# Patient Record
Sex: Male | Born: 1965 | Race: White | Hispanic: No | Marital: Married | Smoking: Never smoker
Health system: Southern US, Community
[De-identification: ages and names within clinical notes are randomized; demographics above are authoritative.]

---

## 2019-11-10 ENCOUNTER — Emergency Department (INDEPENDENT_AMBULATORY_CARE_PROVIDER_SITE_OTHER): Payer: BC Managed Care – PPO

## 2019-11-10 ENCOUNTER — Emergency Department (INDEPENDENT_AMBULATORY_CARE_PROVIDER_SITE_OTHER)
Admission: RE | Admit: 2019-11-10 | Discharge: 2019-11-10 | Disposition: A | Discharge status: Home / Self Care | Payer: BC Managed Care – PPO | Source: Ambulatory Visit | Attending: Family Medicine | Admitting: Family Medicine

## 2019-11-10 ENCOUNTER — Other Ambulatory Visit: Payer: Self-pay

## 2019-11-10 VITALS — BP 137/92 | HR 72 | Temp 98.1°F | Resp 16 | Wt 155.8 lb

## 2019-11-10 DIAGNOSIS — R6881 Early satiety: Secondary | ICD-10-CM

## 2019-11-10 DIAGNOSIS — Z8639 Personal history of other endocrine, nutritional and metabolic disease: Secondary | ICD-10-CM

## 2019-11-10 DIAGNOSIS — R140 Abdominal distension (gaseous): Secondary | ICD-10-CM

## 2019-11-10 LAB — COMPLETE METABOLIC PANEL WITH GFR
AG Ratio: 1.5 (calc) (ref 1.0–2.5)
ALT: 32 U/L (ref 9–46)
AST: 38 U/L — ABNORMAL HIGH (ref 10–35)
Albumin: 4.5 g/dL (ref 3.6–5.1)
Alkaline phosphatase (APISO): 89 U/L (ref 35–144)
BUN: 18 mg/dL (ref 7–25)
CO2: 26 mmol/L (ref 20–32)
Calcium: 9.4 mg/dL (ref 8.6–10.3)
Chloride: 101 mmol/L (ref 98–110)
Creat: 1.2 mg/dL (ref 0.70–1.33)
GFR, Est African American: 80 mL/min/{1.73_m2} (ref >=60)
GFR, Est Non African American: 69 mL/min/{1.73_m2} (ref >=60)
Globulin: 3 g/dL (calc) (ref 1.9–3.7)
Glucose, Bld: 78 mg/dL (ref 65–99)
Potassium: 4.1 mmol/L (ref 3.5–5.3)
Sodium: 139 mmol/L (ref 135–146)
Total Bilirubin: 1.2 mg/dL (ref 0.2–1.2)
Total Protein: 7.5 g/dL (ref 6.1–8.1)

## 2019-11-10 LAB — TSH: TSH: 1.69 mIU/L (ref 0.40–4.50)

## 2019-11-10 LAB — POCT CBC W AUTO DIFF (K'VILLE URGENT CARE)

## 2019-11-10 LAB — POCT URINALYSIS DIP (MANUAL ENTRY)
Bilirubin, UA: NEGATIVE
Blood, UA: NEGATIVE
Glucose, UA: NEGATIVE mg/dL
Nitrite, UA: NEGATIVE
Protein Ur, POC: NEGATIVE mg/dL
Spec Grav, UA: 1.015 (ref 1.010–1.025)
Urobilinogen, UA: 0.2 E.U./dL
pH, UA: 5.5 (ref 5.0–8.0)

## 2019-11-10 LAB — LIPASE: Lipase: 8 U/L (ref 7–60)

## 2019-11-10 LAB — AMYLASE: Amylase: 39 U/L (ref 21–101)

## 2019-11-10 LAB — SPECIMEN COMPROMISED

## 2019-11-10 NOTE — ED Provider Notes (Signed)
[facility]    CSN: [id number] Arrival date & time: 11/10/19  0844      History   Chief Complaint Chief Complaint  Patient presents with  . Appointment  . Abdominal Pain    HPI JAI STEIL is a 54 y.o. male.   Patient complains of onset of vague mild upper and mid-abdominal pain three days ago, with difficulty eating because of his discomfort.  Eating causes a "pressure" sensation in the center of his chest.  He states that he has had early satiety and a feeling of abdominal bloating for about three weeks.  He has had decreased frequency of bowel movements for three weeks, often having to strain to have a bowel movement.  He denies nausea/vomiting, fevers, chills, and sweats, and GU symptoms. He has no past surgical history.  He states that he is hesitant to eat because he is worried that he may have an obstruction.  The history is provided by the patient.  Abdominal Pain Pain location: upper and mid-abdomen. Pain quality: fullness and pressure   Pain radiates to:  Does not radiate Pain severity:  Mild Onset quality:  Sudden Duration:  3 days Timing:  Intermittent Progression:  Unchanged Chronicity:  New Context: eating   Relieved by:  None tried Worsened by:  Eating Ineffective treatments:  None tried Associated symptoms: anorexia, belching and constipation   Associated symptoms: no chest pain, no chills, no cough, no diarrhea, no dysuria, no fatigue, no fever, no flatus, no hematemesis, no hematochezia, no hematuria, no melena, no nausea, no shortness of breath, no sore throat and no vomiting   Risk factors: has not had multiple surgeries     History reviewed. No pertinent past medical history.  There are no problems to display for this patient.   History reviewed. No pertinent surgical history.     Home Medications    Prior to Admission medications   Medication Sig Start Date End Date Taking? Authorizing Provider  atorvastatin (LIPITOR) 40 MG  tablet Take 40 mg by mouth daily.   Yes [provider]  levothyroxine (SYNTHROID) 75 MCG tablet Take 75 mcg by mouth daily before breakfast.   Yes [provider]    Family History Family History  Problem Relation Age of Onset  . Hypertension Mother   . Multiple myeloma Mother     Social History Social History   Tobacco Use  . Smoking status: Never Smoker  . Smokeless tobacco: Never Used  Vaping Use  . Vaping Use: Never used  Substance Use Topics  . Alcohol use: Not Currently  . Drug use: Not on file     Allergies   Patient has no known allergies.   Review of Systems Review of Systems  Constitutional: Negative for chills, fatigue and fever.  HENT: Negative for sore throat.   Respiratory: Negative for cough, chest tightness and shortness of breath.   Cardiovascular: Negative for chest pain.  Gastrointestinal: Positive for abdominal pain, anorexia and constipation. Negative for abdominal distention, blood in stool, diarrhea, flatus, hematemesis, hematochezia, melena, nausea and vomiting.  Genitourinary: Negative for dysuria and hematuria.     Physical Exam Triage Vital Signs ED Triage Vitals  Enc Vitals Group     BP 11/10/19 0904 (!) 137/92     Pulse Rate 11/10/19 0904 72     Resp 11/10/19 0904 16     Temp 11/10/19 0904 98.1 F (36.7 C)     Temp Source 11/10/19 0904 Oral  SpO2 11/10/19 0904 98 %     Weight 11/10/19 0933 155 lb 12.8 oz (70.7 kg)     Height --      Head Circumference --      Peak Flow --      Pain Score 11/10/19 0900 1     Pain Loc --      Pain Edu? --      Excl. in [place]? --    No data found.  Updated Vital Signs BP (!) 137/92 (BP Location: Right Arm)   Pulse 72   Temp 98.1 F (36.7 C) (Oral)   Resp 16   Wt 70.7 kg   SpO2 98%   Visual Acuity Right Eye Distance:   Left Eye Distance:   Bilateral Distance:    Right Eye Near:   Left Eye Near:    Bilateral Near:     Physical Exam Nursing notes and Vital Signs  reviewed. Appearance:  Patient appears stated age, and in no acute distress Eyes:  Pupils are equal, round, and reactive to light and accomodation.  Extraocular movement is intact.  Conjunctivae are not inflamed  Ears:  Canals normal.  Tympanic membranes normal.  Nose:  Normal turbinates.  No sinus tenderness.    Pharynx:  Normal Neck:  Supple.  No adenopathy or thyromegaly.  Lungs:  Clear to auscultation.  Breath sounds are equal.  Moving air well. Heart:  Regular rate and rhythm without murmurs, rubs, or gallops.  Abdomen:  Nontender without masses or hepatosplenomegaly.  Bowel sounds are present.  No CVA or flank tenderness.  Extremities:  No edema.  Skin:  No rash present.   UC Treatments / Results  Labs (all labs ordered are listed, but only abnormal results are displayed) Labs Reviewed  COMPLETE METABOLIC PANEL WITH GFR  [id number]  POCT CBC W AUTO DIFF ([facility]):  WBC 8.7; LY 28.4; MO 6.6; GR 65.0; Hgb 14.6; Platelets    POCT URINALYSIS DIP (MANUAL ENTRY)    EKG   Radiology DG Abd 2 Views  Result Date: 11/10/2019 CLINICAL DATA:  Upper mid abdominal pain since 3 days ago. EXAM: ABDOMEN - 2 VIEW COMPARISON:  None. FINDINGS: The bowel gas pattern is normal. There is no evidence of free air. No radio-opaque calculi or other significant radiographic abnormality is seen. IMPRESSION: Negative. Electronically Signed   By: Kerby Moors M.D.   On: 11/10/2019 10:02    Procedures Procedures (including critical care time)  Medications Ordered in UC Medications - No data to display  Initial Impression / Assessment and Plan / UC Course  I have reviewed the triage vital signs and the nursing notes.  Pertinent labs & imaging results that were available during my care of the patient were reviewed by me and considered in my medical decision making (see chart for details).    ? GERD; ?IBS; ?control hypothyroid.  Review of records reveals that patient had elevated TSH on  09/24/18, but values normalized afterwards.  CMP 09/24/18 revealed a minimally elevated ALT. TSH, CMP, lipase, amylase, urinalysis pending. Followup with Family Doctor for further evaluation.   Final Clinical Impressions(s) / UC Diagnoses   Final diagnoses:  Early satiety  History of hypothyroidism   Discharge Instructions   None    ED Prescriptions    None        Kandra Nicolas, MD 11/18/19 1314

## 2019-11-10 NOTE — ED Notes (Signed)
Patient unable to provide urine specimen after 3 attempts, 5 cups of water provided. Pt will continue to try and provide specimen.

## 2019-11-10 NOTE — ED Triage Notes (Addendum)
Patient presents to Urgent Care with complaints of upper mid abdominal pain since three days ago. Patient reports he has not been able to eat food due to the discomfort. Pt states he has been able to tolerate liquids, but food creates a "pressure" right in the center of his chest.  Pt denies cardiac hx, feels like the irritation extends from his chest up into his throat.  Patient adds that he has not been having BMs, was able to pass a small amount of stool one time in the past 3 days. Is worried he may have an obstruction and has been hesitant to eat food.

## 2019-11-20 ENCOUNTER — Emergency Department: Admission: EM | Admit: 2019-11-20 | Discharge: 2019-11-20 | Discharge status: Absent without leave | Payer: Self-pay

## 2021-08-01 IMAGING — DX DG ABDOMEN 2V
2 series · 2 of 2 positions shown · non-contrast
Comparison: None.

CLINICAL DATA: Upper mid abdominal pain since 3 days ago.

EXAM:
ABDOMEN - 2 VIEW

[abdomen erect]
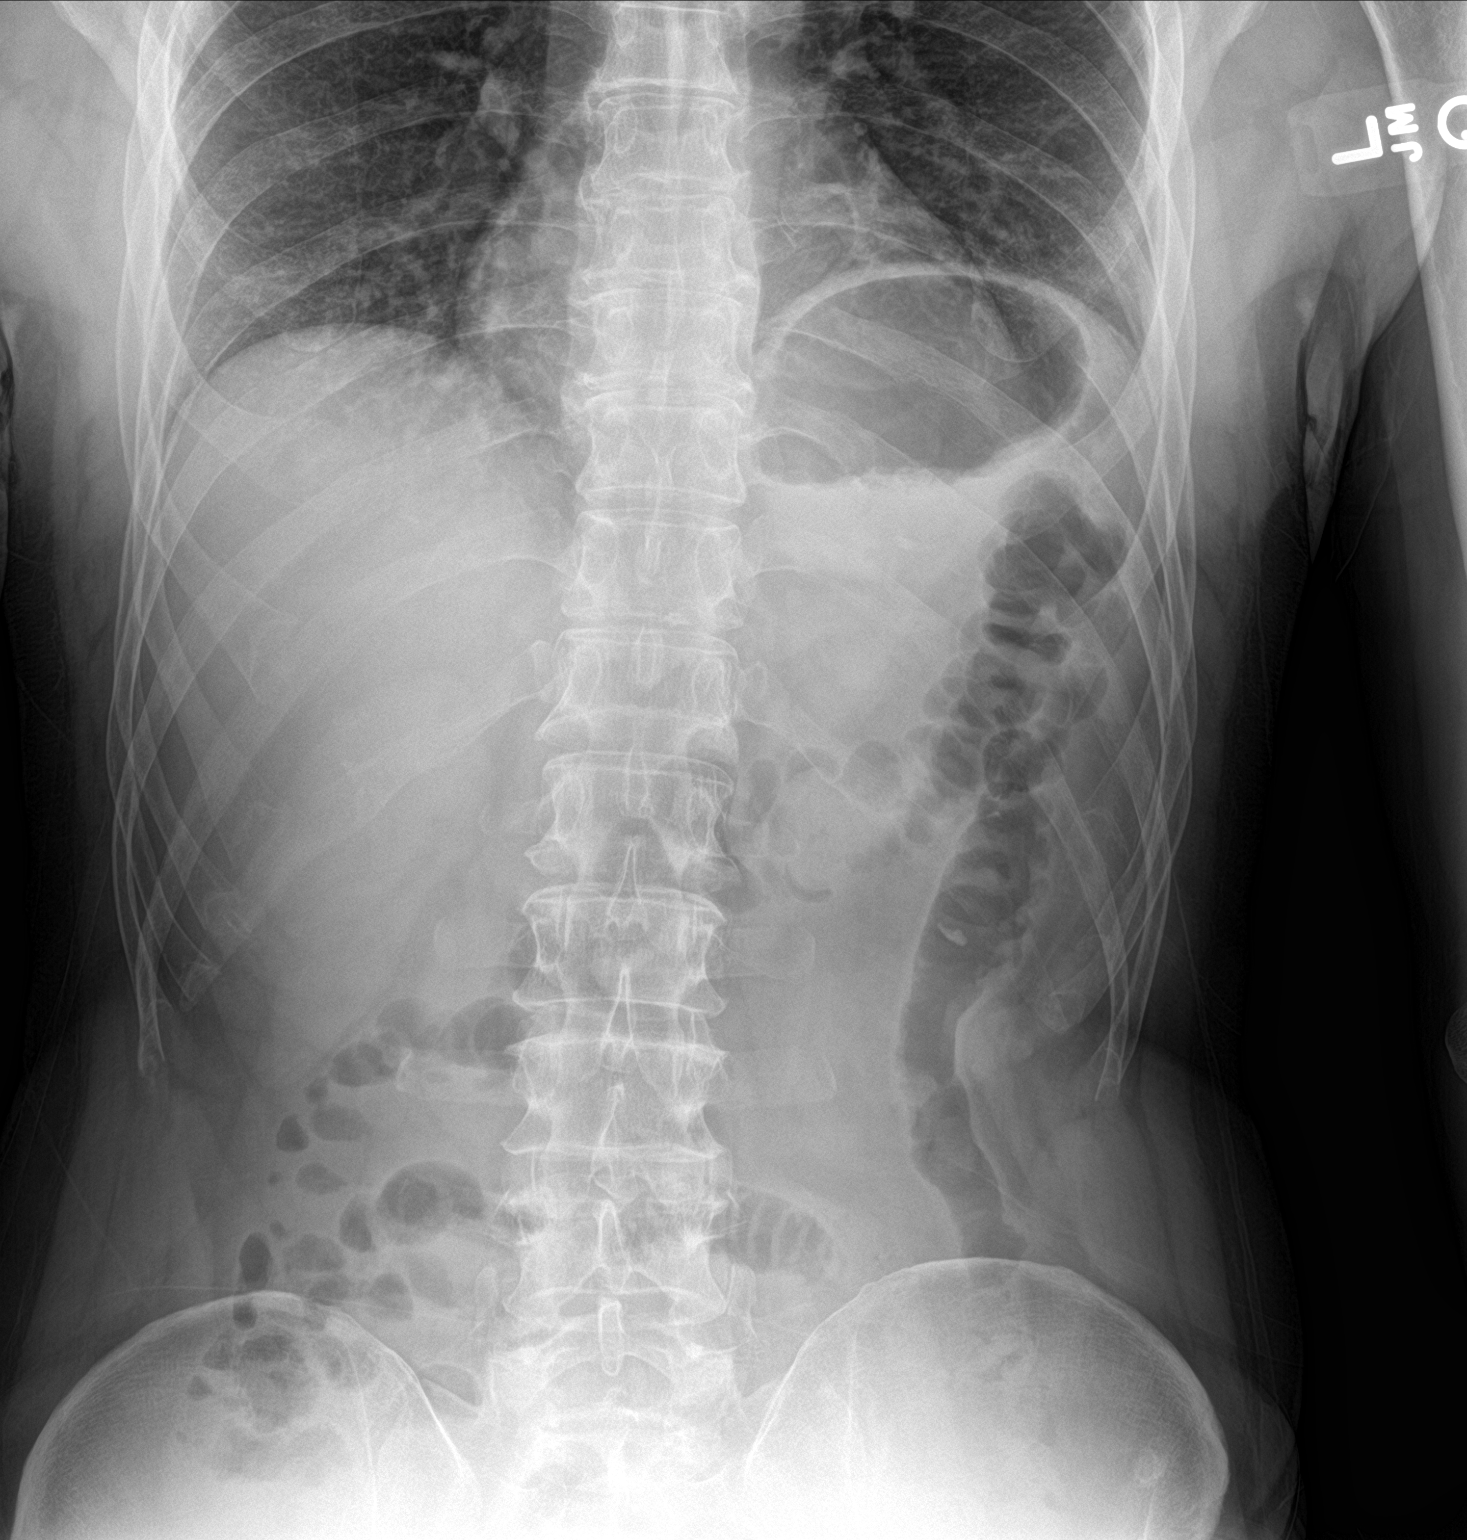

[abdomen supine]
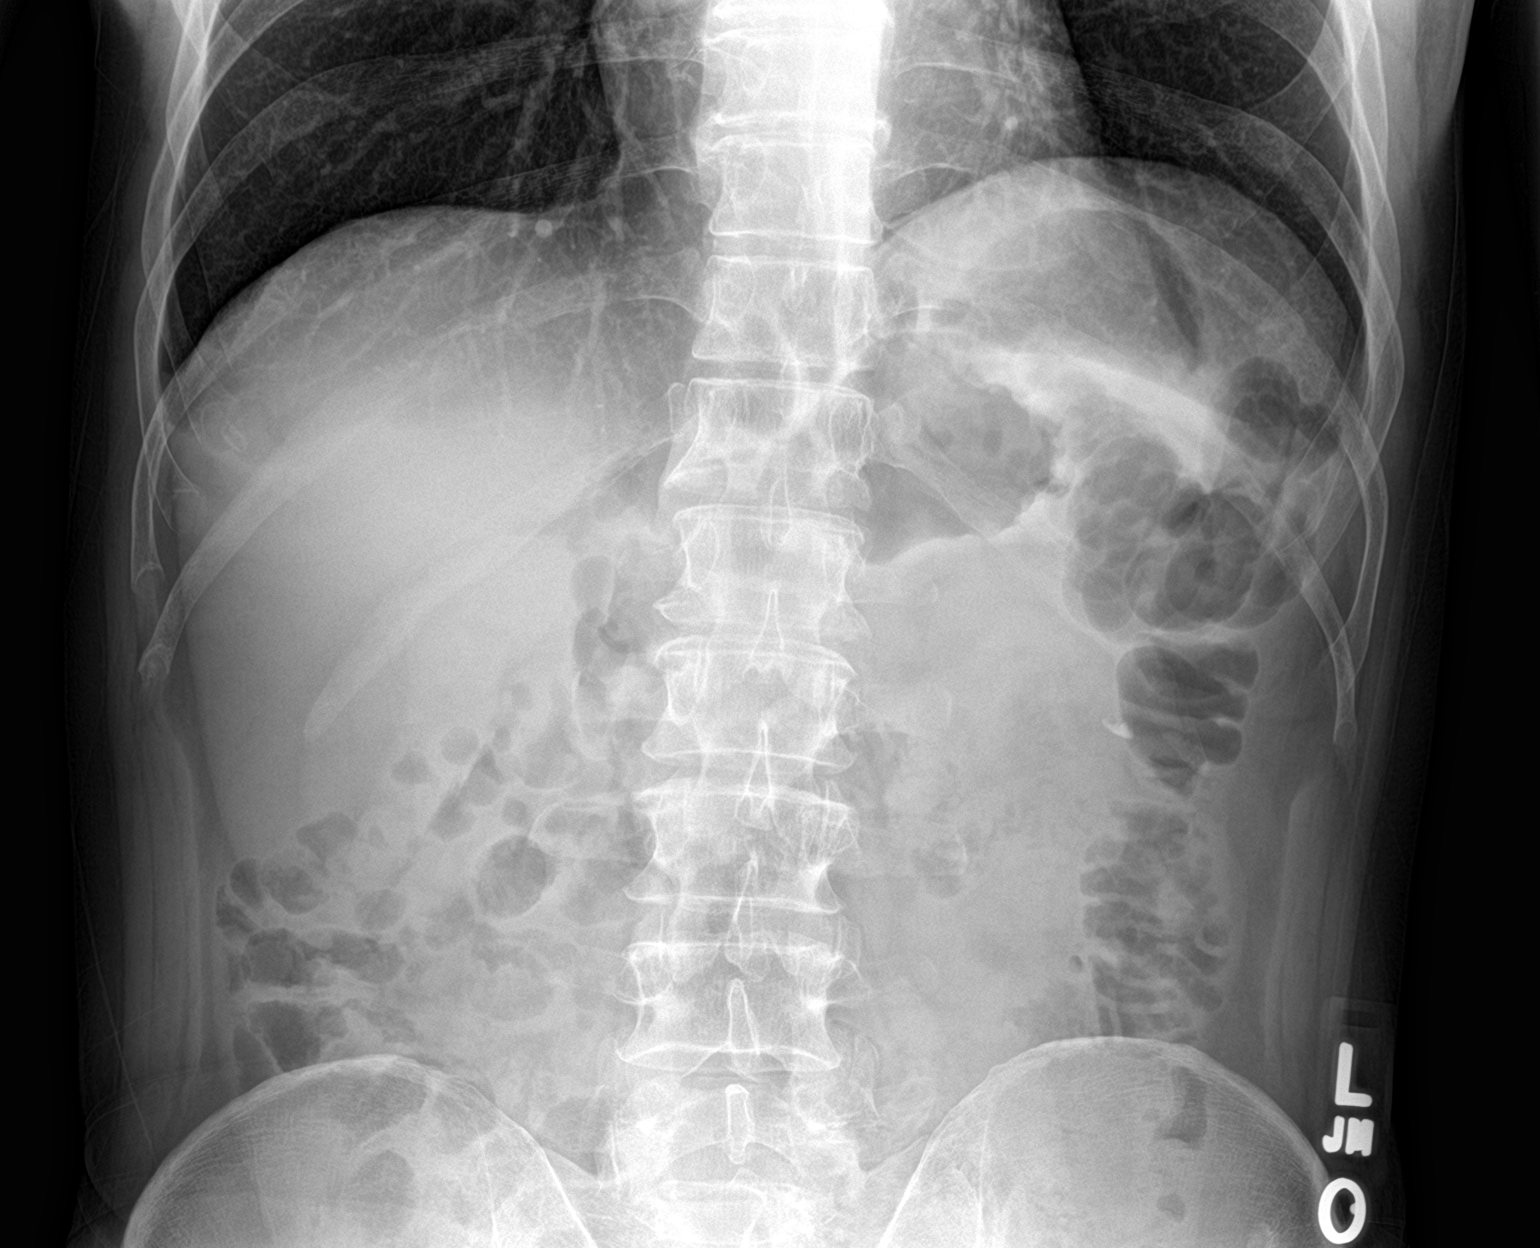

[2 of 2 positions shown; findings below may reference images not displayed]

FINDINGS: The bowel gas pattern is normal. There is no evidence of free air.
No radio-opaque calculi or other significant radiographic
abnormality is seen.
IMPRESSION: Negative.
